# Patient Record
Sex: Female | Born: 2000 | Race: Black or African American | Hispanic: No | Marital: Single
Health system: Southern US, Community
[De-identification: ages and names within clinical notes are randomized; demographics above are authoritative.]

## PROBLEM LIST (undated history)

## (undated) ENCOUNTER — Emergency Department (HOSPITAL_COMMUNITY): Payer: Medicaid Other | Source: Home / Self Care

## (undated) DIAGNOSIS — H409 Unspecified glaucoma: Secondary | ICD-10-CM

## (undated) DIAGNOSIS — F329 Major depressive disorder, single episode, unspecified: Secondary | ICD-10-CM

## (undated) DIAGNOSIS — E119 Type 2 diabetes mellitus without complications: Secondary | ICD-10-CM

## (undated) DIAGNOSIS — F32A Depression, unspecified: Secondary | ICD-10-CM

## (undated) DIAGNOSIS — J45909 Unspecified asthma, uncomplicated: Secondary | ICD-10-CM

## (undated) DIAGNOSIS — E7800 Pure hypercholesterolemia, unspecified: Secondary | ICD-10-CM

## (undated) DIAGNOSIS — F419 Anxiety disorder, unspecified: Secondary | ICD-10-CM

## (undated) DIAGNOSIS — M419 Scoliosis, unspecified: Secondary | ICD-10-CM

## (undated) DIAGNOSIS — K219 Gastro-esophageal reflux disease without esophagitis: Secondary | ICD-10-CM

## (undated) HISTORY — PX: NASAL SEPTUM SURGERY: SHX37

## (undated) HISTORY — PX: TONSILLECTOMY AND ADENOIDECTOMY: SHX28

## (undated) HISTORY — PX: TONSILLECTOMY: SUR1361

---
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
## 2021-08-08 ENCOUNTER — Other Ambulatory Visit: Payer: Self-pay

## 2021-08-08 ENCOUNTER — Emergency Department (HOSPITAL_COMMUNITY): Payer: Medicaid Other

## 2021-08-08 ENCOUNTER — Emergency Department (HOSPITAL_COMMUNITY)
Admission: EM | Admit: 2021-08-08 | Discharge: 2021-08-08 | Disposition: A | Discharge status: Home / Self Care | Payer: Medicaid Other | Attending: Emergency Medicine | Admitting: Emergency Medicine

## 2021-08-08 ENCOUNTER — Encounter (HOSPITAL_COMMUNITY): Payer: Self-pay | Admitting: Emergency Medicine

## 2021-08-08 DIAGNOSIS — Z794 Long term (current) use of insulin: Secondary | ICD-10-CM | POA: Diagnosis not present

## 2021-08-08 DIAGNOSIS — F13930 Sedative, hypnotic or anxiolytic use, unspecified with withdrawal, uncomplicated: Secondary | ICD-10-CM | POA: Insufficient documentation

## 2021-08-08 DIAGNOSIS — Z20822 Contact with and (suspected) exposure to covid-19: Secondary | ICD-10-CM | POA: Insufficient documentation

## 2021-08-08 DIAGNOSIS — E1165 Type 2 diabetes mellitus with hyperglycemia: Secondary | ICD-10-CM | POA: Insufficient documentation

## 2021-08-08 DIAGNOSIS — R531 Weakness: Secondary | ICD-10-CM | POA: Diagnosis present

## 2021-08-08 LAB — BLOOD GAS, VENOUS
Acid-Base Excess: 2.9 mmol/L — ABNORMAL HIGH (ref 0.0–2.0)
Bicarbonate: 27.1 mmol/L (ref 20.0–28.0)
O2 Saturation: 93.4 %
Patient temperature: 98.6
pCO2, Ven: 41.5 mmHg — ABNORMAL LOW (ref 44.0–60.0)
pH, Ven: 7.43 (ref 7.250–7.430)
pO2, Ven: 69.3 mmHg — ABNORMAL HIGH (ref 32.0–45.0)

## 2021-08-08 LAB — CBC WITH DIFFERENTIAL/PLATELET
Abs Immature Granulocytes: 0.04 K/uL (ref 0.00–0.07)
Basophils Absolute: 0.1 K/uL (ref 0.0–0.1)
Basophils Relative: 1 %
Eosinophils Absolute: 0.1 K/uL (ref 0.0–0.5)
Eosinophils Relative: 1 %
HCT: 42.7 % (ref 36.0–46.0)
Hemoglobin: 14.7 g/dL (ref 12.0–15.0)
Immature Granulocytes: 0 %
Lymphocytes Relative: 25 %
Lymphs Abs: 3.2 K/uL (ref 0.7–4.0)
MCH: 29.6 pg (ref 26.0–34.0)
MCHC: 34.4 g/dL (ref 30.0–36.0)
MCV: 86.1 fL (ref 80.0–100.0)
Monocytes Absolute: 1.2 K/uL — ABNORMAL HIGH (ref 0.1–1.0)
Monocytes Relative: 9 %
Neutro Abs: 8.3 K/uL — ABNORMAL HIGH (ref 1.7–7.7)
Neutrophils Relative %: 64 %
Platelets: 425 K/uL — ABNORMAL HIGH (ref 150–400)
RBC: 4.96 MIL/uL (ref 3.87–5.11)
RDW: 13.4 % (ref 11.5–15.5)
WBC: 13 K/uL — ABNORMAL HIGH (ref 4.0–10.5)
nRBC: 0 % (ref 0.0–0.2)

## 2021-08-08 LAB — RESP PANEL BY RT-PCR (FLU A&B, COVID) ARPGX2
Influenza A by PCR: NEGATIVE
Influenza B by PCR: NEGATIVE
SARS Coronavirus 2 by RT PCR: NEGATIVE

## 2021-08-08 LAB — RAPID URINE DRUG SCREEN, HOSP PERFORMED
Amphetamines: NOT DETECTED
Barbiturates: NOT DETECTED
Benzodiazepines: NOT DETECTED
Cocaine: POSITIVE — AB
Opiates: POSITIVE — AB
Tetrahydrocannabinol: POSITIVE — AB

## 2021-08-08 LAB — CBG MONITORING, ED
Glucose-Capillary: 500 mg/dL — ABNORMAL HIGH (ref 70–99)
Glucose-Capillary: 317 mg/dL — ABNORMAL HIGH (ref 70–99)

## 2021-08-08 LAB — COMPREHENSIVE METABOLIC PANEL WITH GFR
ALT: 14 U/L (ref 0–44)
AST: 24 U/L (ref 15–41)
Albumin: 3.9 g/dL (ref 3.5–5.0)
Alkaline Phosphatase: 96 U/L (ref 38–126)
Anion gap: 15 (ref 5–15)
BUN: 13 mg/dL (ref 6–20)
CO2: 22 mmol/L (ref 22–32)
Calcium: 9.5 mg/dL (ref 8.9–10.3)
Chloride: 91 mmol/L — ABNORMAL LOW (ref 98–111)
Creatinine, Ser: 0.63 mg/dL (ref 0.44–1.00)
GFR, Estimated: >60 mL/min
Glucose, Bld: 433 mg/dL — ABNORMAL HIGH (ref 70–99)
Potassium: 4.7 mmol/L (ref 3.5–5.1)
Sodium: 128 mmol/L — ABNORMAL LOW (ref 135–145)
Total Bilirubin: 0.8 mg/dL (ref 0.3–1.2)
Total Protein: 7.9 g/dL (ref 6.5–8.1)

## 2021-08-08 LAB — I-STAT BETA HCG BLOOD, ED (MC, WL, AP ONLY): I-stat hCG, quantitative: <5 m[IU]/mL

## 2021-08-08 LAB — ETHANOL: Alcohol, Ethyl (B): <10 mg/dL

## 2021-08-08 LAB — TROPONIN I (HIGH SENSITIVITY): Troponin I (High Sensitivity): 4 ng/L

## 2021-08-08 MED ORDER — LORAZEPAM 2 MG/ML IJ SOLN
1.0000 mg | Freq: Once | INTRAMUSCULAR | Status: AC
Start: 2021-08-08 — End: 2021-08-08
  Administered 2021-08-08: 1 mg via INTRAVENOUS
  Filled 2021-08-08: qty 1

## 2021-08-08 MED ORDER — SODIUM CHLORIDE 0.9 % IV BOLUS
1000.0000 mL | Freq: Once | INTRAVENOUS | Status: AC
Start: 2021-08-08 — End: 2021-08-08
  Administered 2021-08-08: 1000 mL via INTRAVENOUS

## 2021-08-08 MED ORDER — CHLORDIAZEPOXIDE HCL 25 MG PO CAPS
ORAL_CAPSULE | ORAL | 0 refills | Status: AC
Start: 2021-08-08 — End: ?

## 2021-08-08 MED ORDER — ONDANSETRON 4 MG PO TBDP: 4.0000 mg | ORAL_TABLET | Freq: Three times a day (TID) | ORAL | 0 refills | Status: AC | PRN

## 2021-08-08 NOTE — ED Provider Notes (Signed)
[facility]-EMERGENCY DEPT Provider Note   CSN: [id number] Arrival date & time: 08/08/21  1736     History  Chief Complaint  Patient presents with   Withdrawal    Kristi [name] is a 21 y.o. female.  Patient with history of type 1 diabetes, on NovoLog pen, chronic use of Xanax and cocaine, last use 4 days ago --presents for evaluation of weakness, jitteriness, constipation and overall concerns for withdrawals.  She denies alcohol or drug use.  She has not been taking her insulin over the past several days.  No vomiting.  Several days ago she had a fever without other symptoms, this resolved.  No chest pain or shortness of breath.  No seizures.  She is unable to tell me the amount of Xanax that she recently used but states that it had been on a daily basis.      Home Medications Prior to Admission medications   Medication Sig Start Date End Date Taking? Authorizing Provider  ACCU-CHEK FASTCLIX LANCETS MISC 1 each by Does not apply route as directed. Check sugar 6 x daily 08/19/16   Dessa Phi, MD  albuterol (PROVENTIL HFA;VENTOLIN HFA) 108 (90 Base) MCG/ACT inhaler Inhale 2 puffs into the lungs every 6 (six) hours as needed. Reported on 01/06/2016 01/07/16   Marijo File, MD  cetirizine (ZYRTEC) 10 MG tablet Take 1 tablet (10 mg total) by mouth daily. 08/25/18   Dahlia Byes A, NP  dicyclomine (BENTYL) 20 MG tablet Take 1 tablet (20 mg total) by mouth 2 (two) times daily. 10/14/20   Khatri, Hina, PA-C  escitalopram (LEXAPRO) 20 MG tablet Take 0.5 tablets (10 mg total) by mouth daily. 08/19/16   Dorene Sorrow, MD  glucagon 1 MG injection Inject 1 mg IM for treatment of severe hypoglycemia. 03/04/16   Dessa Phi, MD  glucose blood (ACCU-CHEK GUIDE) test strip Use as instructed for 6 checks per day plus per protocol for hyper/hypoglycemia 08/19/16   Dessa Phi, MD  hydrOXYzine (ATARAX/VISTARIL) 50 MG tablet Take 1 tablet (50 mg total) by mouth at bedtime as  needed (insomnia). 07/05/16   Denzil Magnuson, NP  ibuprofen (ADVIL,MOTRIN) 200 MG tablet Take 200 mg by mouth every 6 (six) hours as needed for fever, headache or mild pain.    [provider]  insulin aspart (NOVOLOG FLEXPEN) 100 UNIT/ML FlexPen Per diabetes care plan up to 50 units per day 08/19/16   Dessa Phi, MD  insulin aspart (NOVOLOG) 100 UNIT/ML FlexPen Use up to 50 units daily 05/12/16   Dessa Phi, MD  Insulin Glargine (LANTUS SOLOSTAR) 100 UNIT/ML Solostar Pen Up to 50 units per day as directed by MD 08/19/16   Dessa Phi, MD  Insulin Pen Needle (INSUPEN PEN NEEDLES) 32G X 4 MM MISC BD Pen Needles- brand specific. Inject insulin via insulin pen 6 x daily 08/19/16   Dessa Phi, MD  lisinopril (PRINIVIL,ZESTRIL) 5 MG tablet Take 1 tablet (5 mg total) by mouth daily. 05/11/16   Simha, Bartolo Darter, MD  metFORMIN (GLUCOPHAGE) 500 MG tablet TAKE 1 TABLET(500 MG) BY MOUTH TWICE DAILY 04/09/16   Simha, Shruti V, MD  omeprazole (PRILOSEC) 20 MG capsule Take 1 capsule (20 mg total) by mouth daily. 08/25/18   Dahlia Byes A, NP  ondansetron (ZOFRAN ODT) 4 MG disintegrating tablet Take 1 tablet (4 mg total) by mouth every 8 (eight) hours as needed for nausea or vomiting. 10/14/20   Khatri, Hina, PA-C  valsartan (DIOVAN) 80 MG tablet Take  80 mg by mouth daily.    [provider]      Allergies    Lisinopril    Review of Systems   Review of Systems  Physical Exam Updated Vital Signs BP (!) 150/95 (BP Location: Left Arm)    Pulse (!) 148    Temp 98.7 F (37.1 C) (Oral)    Resp 18    LMP 07/29/2021    SpO2 100%   Physical Exam Vitals and nursing note reviewed.  Constitutional:      General: She is not in acute distress.    Appearance: She is well-developed.  HENT:     Head: Normocephalic and atraumatic.     Right Ear: External ear normal.     Left Ear: External ear normal.     Nose: Nose normal.     Mouth/Throat:     Mouth: Mucous membranes are moist.   Eyes:     Conjunctiva/sclera: Conjunctivae normal.  Cardiovascular:     Rate and Rhythm: Regular rhythm. Tachycardia present.     Heart sounds: No murmur heard. Pulmonary:     Effort: No respiratory distress.     Breath sounds: No wheezing, rhonchi or rales.  Abdominal:     Palpations: Abdomen is soft.     Tenderness: There is no abdominal tenderness. There is no guarding or rebound.  Musculoskeletal:     Cervical back: Normal range of motion and neck supple.     Right lower leg: No edema.     Left lower leg: No edema.  Skin:    General: Skin is warm and dry.     Findings: No rash.  Neurological:     General: No focal deficit present.     Mental Status: She is alert. Mental status is at baseline.     Motor: No weakness.  Psychiatric:        Mood and Affect: Mood normal.    ED Results / Procedures / Treatments   Labs (all labs ordered are listed, but only abnormal results are displayed) Labs Reviewed  COMPREHENSIVE METABOLIC PANEL - Abnormal; Notable for the following components:      Result Value   Sodium 128 (*)    Chloride 91 (*)    Glucose, Bld 433 (*)    All other components within normal limits  RAPID URINE DRUG SCREEN, HOSP PERFORMED - Abnormal; Notable for the following components:   Opiates POSITIVE (*)    Cocaine POSITIVE (*)    Tetrahydrocannabinol POSITIVE (*)    All other components within normal limits  CBC WITH DIFFERENTIAL/PLATELET - Abnormal; Notable for the following components:   WBC 13.0 (*)    Platelets 425 (*)    Neutro Abs 8.3 (*)    Monocytes Absolute 1.2 (*)    All other components within normal limits  BLOOD GAS, VENOUS - Abnormal; Notable for the following components:   pCO2, Ven 41.5 (*)    pO2, Ven 69.3 (*)    Acid-Base Excess 2.9 (*)    All other components within normal limits  CBG MONITORING, ED - Abnormal; Notable for the following components:   Glucose-Capillary 500 (*)    All other components within normal limits  CBG  MONITORING, ED - Abnormal; Notable for the following components:   Glucose-Capillary 317 (*)    All other components within normal limits  RESP PANEL BY RT-PCR (FLU A&B, COVID) ARPGX2  ETHANOL  I-STAT BETA HCG BLOOD, ED ([facility], [facility], [facility] ONLY)  TROPONIN I (HIGH  SENSITIVITY)    EKG EKG Interpretation  Date/Time:  Saturday August 08 2021 17:58:53 EST Ventricular Rate:  138 PR Interval:  113 QRS Duration: 81 QT Interval:  301 QTC Calculation: 456 R Axis:   86 Text Interpretation: Sinus tachycardia Repol abnrm, prob ischemia, inferolateral lds Baseline wander in lead(s) II No prior ECG for comparison. NO STEMI Confirmed by Theda Belfastegeler, Chris ([id number]) on 08/08/2021 6:27:37 PM  Radiology DG Chest Port 1 View  Result Date: 08/08/2021 CLINICAL DATA:  tachycardia, weakness, diabetes EXAM: PORTABLE CHEST 1 VIEW COMPARISON:  Chest x-ray 03/21/2020 FINDINGS: The heart and mediastinal contours are within normal limits. No focal consolidation. No pulmonary edema. No pleural effusion. No pneumothorax. No acute osseous abnormality. IMPRESSION: No active disease. Electronically Signed   By: Tish FredericksonMorgane  Naveau M.D.   On: 08/08/2021 19:05    Procedures Procedures    Medications Ordered in ED Medications  LORazepam (ATIVAN) injection 1 mg (1 mg Intravenous Given 08/08/21 1848)  sodium chloride 0.9 % bolus 1,000 mL (0 mLs Intravenous Stopped 08/08/21 1910)  sodium chloride 0.9 % bolus 1,000 mL (0 mLs Intravenous Stopped 08/08/21 2159)    ED Course/ Medical Decision Making/ A&P    Patient seen and examined. History obtained directly from patient.   Labs/EKG: Independently reviewed and interpreted.  This included: EKG with TWI and sinus tachycardia.  Labs ordered in triage.  Fingerstick blood glucose is 500.  Added VBG.  Imaging: Chest x-ray ordered.  Medications/Fluids: Ordered: IV Ativan 1 mg to help possible benzodiazepine withdrawal and to prevent seizures, IV fluids given hyperglycemia.  Benzodiazepine  withdrawal, hyperglycemia  Most recent vital signs reviewed and are as follows: BP (!) 150/95 (BP Location: Left Arm)    Pulse (!) 148    Temp 98.7 F (37.1 C) (Oral)    Resp 18    LMP 07/29/2021    SpO2 100%   Initial impression: Benzodiazepine withdrawal without seizure, hyperglycemia, will evaluate for possibility of ketoacidosis.  8:47 PM Reassessment performed. Patient appears more comfortable.  Pulse rate improved to 100-110.   Labs and imaging to this point personally reviewed and interpreted including: CBC, BMP.  Reviewed pertinent lab work and imaging with patient at bedside including: Overall reassuring labs with elevated blood sugar, slightly elevated white blood cell count, normal anion gap without other signs of DKA.  Most current vital signs reviewed and are as follows: BP 117/82    Pulse (!) 106    Temp 98.7 F (37.1 C) (Oral)    Resp (!) 24    LMP 07/29/2021    SpO2 97%   Plan: Additional IV fluids, recheck blood sugar and heart rate.  11:32 PM Reassessment performed. Patient appears comfortable.  Heart rate now improved into the 90s after 2 L IV fluids.  Reviewed additional pertinent lab work and imaging with patient at bedside including: Blood sugar in the 300s.  Most current vital signs reviewed and are as follows: BP 108/79    Pulse 93    Temp 98.7 F (37.1 C) (Oral)    Resp 19    LMP 07/29/2021    SpO2 97%   Plan: Discharge home, patient will reassume sliding scale insulin administration at home.  Home treatment: Prescription written for tapered course of Librium, Zofran.  Will give outpatient referrals for substance abuse counseling.  Patient seems receptive.  Return and follow-up instructions: Encouraged return to ED with any seizure-like activity, vomiting, chest pain or shortness of breath. Encouraged patient to follow-up with their provider in  the next week.  Patient verbalized understanding and agreed with plan.                             Medical  Decision Making  Patient is here with concerns for benzodiazepine withdrawal after abrupt cessation several days ago.  Patient chronically uses Xanax.  Also reports cocaine use.  Symptomatic and tachycardic on arrival.  Complicated by the fact that she is a diabetic and has not been taking her insulin.  DKA considered.  Anion gap is normal with normal bicarb.  Normal pH on VBG.  Hyperglycemia treated with 2 L IV fluids with improvement.  Do not feel that patient requires IV insulin tonight.  No other signs of obvious infection.  Cardiac work-up with troponin negative.  EKG with sinus tachycardia.  She looks improved.,  Treatment plan as above.  Patient with supportive family at bedside to help ensure compliance.           Final Clinical Impression(s) / ED Diagnoses Final diagnoses:  Benzodiazepine withdrawal without complication (HCC)  Hyperglycemia due to diabetes mellitus (HCC)    Rx / DC Orders ED Discharge Orders          Ordered    chlordiazePOXIDE (LIBRIUM) 25 MG capsule        08/08/21 2320    ondansetron (ZOFRAN-ODT) 4 MG disintegrating tablet  Every 8 hours PRN        08/08/21 2320              Renne Crigler, PA-C 08/08/21 2336    Tegeler, Canary Brim, MD 08/08/21 [phone]

## 2021-08-08 NOTE — ED Provider Triage Note (Signed)
Emergency Medicine Provider Triage Evaluation Note  Kristi [name] , a 21 y.o. female  was evaluated in triage.  Pt complains of withdrawal from xanax. Patient reports she also tried cocaine earlier this week for the first time. She reports she doesn't know how much xanax she takes but it is ususally "2-3 at a time" once per day. Off and on use for 2 years. Last use was 5 days ago. Denies any A/V hallucinations. Denies any seizures.   Review of Systems  Positive: Nausea, palpitations Negative: Syncope, seizures, vomiting   Physical Exam  BP (!) 150/95 (BP Location: Left Arm)    Pulse (!) 148    Temp 98.7 F (37.1 C) (Oral)    Resp 18    SpO2 100%  Gen:   Awake, anxious appearing, shaking leg Resp:  Normal effort  MSK:   Moves extremities without difficulty  Other:  Tachycardic in the 140s  Medical Decision Making  Medically screening exam initiated at 5:49 PM.  Appropriate orders placed.  Kristi [name] was informed that the remainder of the evaluation will be completed by another provider, this initial triage assessment does not replace that evaluation, and the importance of remaining in the ED until their evaluation is complete.  EKG with labs. Patient needs to be roomed now.    Sherrell Puller, PA-C 08/08/21 1752

## 2021-08-08 NOTE — Discharge Instructions (Addendum)
Please read and follow all provided instructions.  Your diagnoses today include:  1. Benzodiazepine withdrawal without complication ([place])   2. Hyperglycemia due to diabetes mellitus ([place])     Tests performed today include: Complete blood cell count: Shows slightly elevated white blood cell count Basic metabolic panel: High blood sugar and low sodium and chloride due to high blood sugar Blood test for the heart: No signs of stress on the heart EKG: Shows fast heart rate due to withdrawal Vital signs. See below for your results today.   Medications prescribed:  Librium taper: Medication to help with withdrawals  Zofran (ondansetron) - for nausea and vomiting  Take any prescribed medications only as directed.  Home care instructions:  Follow any educational materials contained in this packet.  BE VERY CAREFUL not to take multiple medicines containing Tylenol (also called acetaminophen). Doing so can lead to an overdose which can damage your liver and cause liver failure and possibly death.   Follow-up instructions: Please follow-up with your primary care provider in the next 7 days for further evaluation of your symptoms.   Please also follow-up with referrals for substance abuse counseling.  This is very important to help you maintain sobriety.  Return instructions:  Please return to the Emergency Department if you experience worsening symptoms.  Please return if you have any other emergent concerns.  Additional Information:  Your vital signs today were: BP 108/79    Pulse 93    Temp 98.7 F (37.1 C) (Oral)    Resp 19    LMP 07/29/2021    SpO2 97%  If your blood pressure (BP) was elevated above 135/85 this visit, please have this repeated by your doctor within one month. --------------

## 2021-08-08 NOTE — ED Triage Notes (Signed)
Patient reports intermittent xanax use x2 years. States last use x4 days ago. Reports feeling weak with tremors and concerned for withdrawal.

## 2021-09-13 NOTE — Progress Notes (Signed)
Erroneous encounter

## 2021-09-18 ENCOUNTER — Encounter: Payer: Medicaid Other | Admitting: Family

## 2021-09-18 DIAGNOSIS — Z7689 Persons encountering health services in other specified circumstances: Secondary | ICD-10-CM

## 2021-09-18 DIAGNOSIS — E1065 Type 1 diabetes mellitus with hyperglycemia: Secondary | ICD-10-CM

## 2021-09-18 DIAGNOSIS — F13930 Sedative, hypnotic or anxiolytic use, unspecified with withdrawal, uncomplicated: Secondary | ICD-10-CM

## 2021-11-26 ENCOUNTER — Telehealth: Payer: Self-pay

## 2021-12-01 ENCOUNTER — Other Ambulatory Visit: Payer: Self-pay

## 2021-12-01 DIAGNOSIS — O0990 Supervision of high risk pregnancy, unspecified, unspecified trimester: Secondary | ICD-10-CM | POA: Insufficient documentation

## 2021-12-01 MED ORDER — BLOOD PRESSURE KIT DEVI: 1.0000 | 0 refills | Status: AC

## 2021-12-01 NOTE — Progress Notes (Signed)
Rx for BP Cuff sent to [facility] ?

## 2021-12-02 ENCOUNTER — Telehealth: Payer: Self-pay

## 2021-12-02 ENCOUNTER — Ambulatory Visit: Payer: Medicaid Other

## 2021-12-02 DIAGNOSIS — O0990 Supervision of high risk pregnancy, unspecified, unspecified trimester: Secondary | ICD-10-CM

## 2021-12-02 NOTE — Progress Notes (Signed)
I have attempted without success to contact this patient by phone X2 to start NOB Intake. Patient did not answer. I left two messages on answering machine to call our Office to start her visit.  ?

## 2021-12-02 NOTE — Telephone Encounter (Signed)
TC to Pt, no answer.  LVMM to call Office ?

## 2021-12-29 ENCOUNTER — Encounter: Payer: Medicaid Other | Admitting: Obstetrics and Gynecology

## 2022-06-01 NOTE — Telephone Encounter (Signed)
Was calling pt to inform of changed appointment for 5/12

## 2023-08-29 ENCOUNTER — Ambulatory Visit: Payer: Medicaid Other | Admitting: Family Medicine

## 2023-08-29 NOTE — Progress Notes (Deleted)
 New Patient Office Visit  Subjective    Patient ID: Kristi [name], female    DOB: 2001/01/20  Age: 23 y.o. MRN: [id number]  CC: No chief complaint on file.   HPI [name] presents to establish care ***  Outpatient Encounter Medications as of 08/29/2023  Medication Sig   ACCU-CHEK FASTCLIX LANCETS MISC 1 each by Does not apply route as directed. Check sugar 6 x daily   albuterol (PROVENTIL HFA;VENTOLIN HFA) 108 (90 Base) MCG/ACT inhaler Inhale 2 puffs into the lungs every 6 (six) hours as needed. Reported on 01/06/2016   Blood Pressure Monitoring (BLOOD PRESSURE KIT) DEVI 1 kit by Does not apply route once a week. Check Blood Pressure regularly and record readings into the [employer] App.  Large Cuff.  DX O90.0   cetirizine (ZYRTEC) 10 MG tablet Take 1 tablet (10 mg total) by mouth daily.   chlordiazePOXIDE (LIBRIUM) 25 MG capsule 50mg  PO TID x 1D, then 25-50mg  PO BID X 1D, then 25-50mg  PO QD X 1D   dicyclomine (BENTYL) 20 MG tablet Take 1 tablet (20 mg total) by mouth 2 (two) times daily.   escitalopram (LEXAPRO) 20 MG tablet Take 0.5 tablets (10 mg total) by mouth daily.   glucagon 1 MG injection Inject 1 mg IM for treatment of severe hypoglycemia.   glucose blood (ACCU-CHEK GUIDE) test strip Use as instructed for 6 checks per day plus per protocol for hyper/hypoglycemia   hydrOXYzine (ATARAX/VISTARIL) 50 MG tablet Take 1 tablet (50 mg total) by mouth at bedtime as needed (insomnia).   ibuprofen (ADVIL,MOTRIN) 200 MG tablet Take 200 mg by mouth every 6 (six) hours as needed for fever, headache or mild pain.   insulin aspart (NOVOLOG FLEXPEN) 100 UNIT/ML FlexPen Per diabetes care plan up to 50 units per day   insulin aspart (NOVOLOG) 100 UNIT/ML FlexPen Use up to 50 units daily   Insulin Glargine (LANTUS SOLOSTAR) 100 UNIT/ML Solostar Pen Up to 50 units per day as directed by MD   Insulin Pen Needle (INSUPEN PEN NEEDLES) 32G X 4 MM MISC BD Pen Needles- brand specific. Inject insulin  via insulin pen 6 x daily   lisinopril (PRINIVIL,ZESTRIL) 5 MG tablet Take 1 tablet (5 mg total) by mouth daily.   metFORMIN (GLUCOPHAGE) 500 MG tablet TAKE 1 TABLET(500 MG) BY MOUTH TWICE DAILY   omeprazole (PRILOSEC) 20 MG capsule Take 1 capsule (20 mg total) by mouth daily.   ondansetron (ZOFRAN-ODT) 4 MG disintegrating tablet Take 1 tablet (4 mg total) by mouth every 8 (eight) hours as needed for nausea or vomiting.   valsartan (DIOVAN) 80 MG tablet Take 80 mg by mouth daily.   No facility-administered encounter medications on file as of 08/29/2023.    Past Medical History:  Diagnosis Date   Anxiety    Asthma    Asthma    Depression    Diabetes mellitus without complication (HCC)    GERD (gastroesophageal reflux disease)    Glaucoma    High cholesterol    Scoliosis     Past Surgical History:  Procedure Laterality Date   NASAL SEPTUM SURGERY     TONSILLECTOMY     TONSILLECTOMY AND ADENOIDECTOMY      Family History  Problem Relation Age of Onset   Diabetes Mother    Obesity Mother    Asthma Mother    Diabetes Father    Hypertension Father    ADD / ADHD Father    Diabetes Maternal Grandmother  Heart disease Maternal Grandmother    Diabetes Maternal Grandfather    Heart disease Maternal Grandfather    Diabetes Paternal Grandmother    Diabetes Paternal Grandfather     Social History   Socioeconomic History   Marital status: Single    Spouse name: Not on file   Number of children: Not on file   Years of education: Not on file   Highest education level: Not on file  Occupational History   Not on file  Tobacco Use   Smoking status: Passive Smoke Exposure - Never Smoker   Smokeless tobacco: Never  Vaping Use   Vaping status: Never Used  Substance and Sexual Activity   Alcohol use: No   Drug use: No   Sexual activity: Never  Other Topics Concern   Not on file  Social History Narrative          Social Drivers of Health   Financial Resource Strain: Not  on file  Food Insecurity: Not on file  Transportation Needs: Not on file  Physical Activity: Not on file  Stress: Not on file  Social Connections: Not on file  Intimate Partner Violence: Not on file    ROS Per HPI      Objective    There were no vitals taken for this visit.  Physical Exam      Assessment & Plan:   There are no diagnoses linked to this encounter.   No follow-ups on file.   Moshe Cipro, FNP
